# Patient Record
Sex: Male | Born: 1990 | Race: Black or African American | Hispanic: No | Marital: Single | Smoking: Current every day smoker
Health system: Southern US, Community
[De-identification: ages and names within clinical notes are randomized; demographics above are authoritative.]

## PROBLEM LIST (undated history)

## (undated) DIAGNOSIS — F17200 Nicotine dependence, unspecified, uncomplicated: Secondary | ICD-10-CM

---

## 2017-12-21 ENCOUNTER — Inpatient Hospital Stay (HOSPITAL_COMMUNITY): Payer: No Typology Code available for payment source

## 2017-12-21 ENCOUNTER — Encounter (HOSPITAL_COMMUNITY): Payer: Self-pay | Admitting: Radiology

## 2017-12-21 ENCOUNTER — Emergency Department (HOSPITAL_COMMUNITY): Payer: No Typology Code available for payment source

## 2017-12-21 ENCOUNTER — Inpatient Hospital Stay (HOSPITAL_COMMUNITY)
Admission: EM | Admit: 2017-12-21 | Discharge: 2017-12-25 | DRG: 958 | Disposition: A | Discharge status: Home / Self Care | Payer: No Typology Code available for payment source | Attending: General Surgery | Admitting: General Surgery

## 2017-12-21 ENCOUNTER — Other Ambulatory Visit: Payer: Self-pay

## 2017-12-21 DIAGNOSIS — S32462A Displaced associated transverse-posterior fracture of left acetabulum, initial encounter for closed fracture: Secondary | ICD-10-CM | POA: Diagnosis present

## 2017-12-21 DIAGNOSIS — S73005A Unspecified dislocation of left hip, initial encounter: Secondary | ICD-10-CM | POA: Diagnosis present

## 2017-12-21 DIAGNOSIS — S2241XA Multiple fractures of ribs, right side, initial encounter for closed fracture: Secondary | ICD-10-CM | POA: Diagnosis present

## 2017-12-21 DIAGNOSIS — S270XXA Traumatic pneumothorax, initial encounter: Secondary | ICD-10-CM | POA: Diagnosis present

## 2017-12-21 DIAGNOSIS — Y92410 Unspecified street and highway as the place of occurrence of the external cause: Secondary | ICD-10-CM

## 2017-12-21 DIAGNOSIS — F17210 Nicotine dependence, cigarettes, uncomplicated: Secondary | ICD-10-CM | POA: Diagnosis present

## 2017-12-21 DIAGNOSIS — Z419 Encounter for procedure for purposes other than remedying health state, unspecified: Secondary | ICD-10-CM

## 2017-12-21 DIAGNOSIS — M25552 Pain in left hip: Secondary | ICD-10-CM | POA: Diagnosis present

## 2017-12-21 DIAGNOSIS — J939 Pneumothorax, unspecified: Secondary | ICD-10-CM

## 2017-12-21 DIAGNOSIS — T148XXA Other injury of unspecified body region, initial encounter: Secondary | ICD-10-CM

## 2017-12-21 DIAGNOSIS — S32422A Displaced fracture of posterior wall of left acetabulum, initial encounter for closed fracture: Secondary | ICD-10-CM | POA: Diagnosis present

## 2017-12-21 DIAGNOSIS — S2249XA Multiple fractures of ribs, unspecified side, initial encounter for closed fracture: Secondary | ICD-10-CM | POA: Diagnosis present

## 2017-12-21 DIAGNOSIS — S32049A Unspecified fracture of fourth lumbar vertebra, initial encounter for closed fracture: Secondary | ICD-10-CM | POA: Diagnosis present

## 2017-12-21 DIAGNOSIS — S27321A Contusion of lung, unilateral, initial encounter: Secondary | ICD-10-CM | POA: Diagnosis present

## 2017-12-21 DIAGNOSIS — S32409A Unspecified fracture of unspecified acetabulum, initial encounter for closed fracture: Secondary | ICD-10-CM

## 2017-12-21 DIAGNOSIS — D62 Acute posthemorrhagic anemia: Secondary | ICD-10-CM | POA: Diagnosis not present

## 2017-12-21 DIAGNOSIS — T1490XA Injury, unspecified, initial encounter: Secondary | ICD-10-CM

## 2017-12-21 DIAGNOSIS — F17200 Nicotine dependence, unspecified, uncomplicated: Secondary | ICD-10-CM | POA: Diagnosis present

## 2017-12-21 HISTORY — DX: Nicotine dependence, unspecified, uncomplicated: F17.200

## 2017-12-21 LAB — I-STAT CHEM 8, ED
BUN: 8 mg/dL (ref 6–20)
CALCIUM ION: 1.09 mmol/L — AB (ref 1.15–1.40)
Chloride: 107 mmol/L (ref 98–111)
Creatinine, Ser: 1.1 mg/dL (ref 0.61–1.24)
Glucose, Bld: 103 mg/dL — ABNORMAL HIGH (ref 70–99)
HEMATOCRIT: 46 % (ref 39.0–52.0)
Hemoglobin: 15.6 g/dL (ref 13.0–17.0)
Potassium: 3.1 mmol/L — ABNORMAL LOW (ref 3.5–5.1)
SODIUM: 142 mmol/L (ref 135–145)
TCO2: 21 mmol/L — AB (ref 22–32)

## 2017-12-21 LAB — PROTIME-INR
INR: 1.19
Prothrombin Time: 15 seconds (ref 11.4–15.2)

## 2017-12-21 LAB — COMPREHENSIVE METABOLIC PANEL
ALBUMIN: 3.9 g/dL (ref 3.5–5.0)
ALT: 56 U/L — ABNORMAL HIGH (ref 0–44)
ANION GAP: 10 (ref 5–15)
AST: 128 U/L — ABNORMAL HIGH (ref 15–41)
Alkaline Phosphatase: 60 U/L (ref 38–126)
BUN: 9 mg/dL (ref 6–20)
CHLORIDE: 109 mmol/L (ref 98–111)
CO2: 23 mmol/L (ref 22–32)
Calcium: 8.8 mg/dL — ABNORMAL LOW (ref 8.9–10.3)
Creatinine, Ser: 1.05 mg/dL (ref 0.61–1.24)
GFR calc Af Amer: >60 mL/min (ref >60)
GFR calc non Af Amer: >60 mL/min (ref >60)
GLUCOSE: 102 mg/dL — AB (ref 70–99)
POTASSIUM: 3.4 mmol/L — AB (ref 3.5–5.1)
SODIUM: 142 mmol/L (ref 135–145)
Total Bilirubin: 0.7 mg/dL (ref 0.3–1.2)
Total Protein: 6.7 g/dL (ref 6.5–8.1)

## 2017-12-21 LAB — CBC
HEMATOCRIT: 45.2 % (ref 39.0–52.0)
HEMOGLOBIN: 14.9 g/dL (ref 13.0–17.0)
MCH: 32.3 pg (ref 26.0–34.0)
MCHC: 33 g/dL (ref 30.0–36.0)
MCV: 97.8 fL (ref 78.0–100.0)
Platelets: 270 10*3/uL (ref 150–400)
RBC: 4.62 MIL/uL (ref 4.22–5.81)
RDW: 13.1 % (ref 11.5–15.5)
WBC: 23.6 10*3/uL — ABNORMAL HIGH (ref 4.0–10.5)

## 2017-12-21 LAB — ETHANOL: Alcohol, Ethyl (B): 67 mg/dL — ABNORMAL HIGH (ref <10)

## 2017-12-21 LAB — CDS SEROLOGY

## 2017-12-21 LAB — I-STAT CG4 LACTIC ACID, ED: Lactic Acid, Venous: 2.75 mmol/L (ref 0.5–1.9)

## 2017-12-21 LAB — LACTIC ACID, PLASMA: Lactic Acid, Venous: 1.2 mmol/L (ref 0.5–1.9)

## 2017-12-21 LAB — SAMPLE TO BLOOD BANK

## 2017-12-21 MED ORDER — DOCUSATE SODIUM 100 MG PO CAPS
100.0000 mg | ORAL_CAPSULE | Freq: Two times a day (BID) | ORAL | Status: DC
  Administered 2017-12-21 – 2017-12-25 (×6): 100 mg via ORAL
  Filled 2017-12-21 (×6): qty 1

## 2017-12-21 MED ORDER — HYDROMORPHONE HCL 1 MG/ML IJ SOLN
1.0000 mg | Freq: Once | INTRAMUSCULAR | Status: AC
  Administered 2017-12-21: 1 mg via INTRAVENOUS

## 2017-12-21 MED ORDER — ONDANSETRON 4 MG PO TBDP: 4.0000 mg | ORAL_TABLET | Freq: Four times a day (QID) | ORAL | Status: DC | PRN

## 2017-12-21 MED ORDER — OXYCODONE HCL 5 MG PO TABS
10.0000 mg | ORAL_TABLET | ORAL | Status: DC | PRN
  Administered 2017-12-21 – 2017-12-25 (×14): 10 mg via ORAL
  Filled 2017-12-21 (×14): qty 2

## 2017-12-21 MED ORDER — MUPIROCIN 2 % EX OINT
1.0000 "application " | TOPICAL_OINTMENT | Freq: Two times a day (BID) | CUTANEOUS | Status: AC
  Administered 2017-12-22 – 2017-12-23 (×2): 1 via NASAL
  Filled 2017-12-21 (×2): qty 22

## 2017-12-21 MED ORDER — OXYCODONE HCL 5 MG PO TABS: 5.0000 mg | ORAL_TABLET | ORAL | Status: DC | PRN

## 2017-12-21 MED ORDER — PROPOFOL 10 MG/ML IV BOLUS: 0.5000 mg/kg | Freq: Once | INTRAVENOUS | Status: DC

## 2017-12-21 MED ORDER — PROPOFOL 10 MG/ML IV BOLUS
INTRAVENOUS | Status: AC
  Filled 2017-12-21: qty 20

## 2017-12-21 MED ORDER — MORPHINE SULFATE (PF) 2 MG/ML IV SOLN
2.0000 mg | INTRAVENOUS | Status: DC | PRN
  Administered 2017-12-22 – 2017-12-24 (×5): 2 mg via INTRAVENOUS
  Filled 2017-12-21 (×5): qty 1

## 2017-12-21 MED ORDER — FENTANYL CITRATE (PF) 100 MCG/2ML IJ SOLN
100.0000 ug | Freq: Once | INTRAMUSCULAR | Status: AC
  Administered 2017-12-21: 100 ug via INTRAVENOUS

## 2017-12-21 MED ORDER — SODIUM CHLORIDE 0.9 % IV SOLN
INTRAVENOUS | Status: DC
  Administered 2017-12-21 – 2017-12-23 (×5): via INTRAVENOUS

## 2017-12-21 MED ORDER — METHOCARBAMOL 500 MG PO TABS
500.0000 mg | ORAL_TABLET | Freq: Three times a day (TID) | ORAL | Status: DC | PRN
  Administered 2017-12-21 – 2017-12-25 (×6): 500 mg via ORAL
  Filled 2017-12-21 (×6): qty 1

## 2017-12-21 MED ORDER — FENTANYL CITRATE (PF) 100 MCG/2ML IJ SOLN
INTRAMUSCULAR | Status: AC
  Filled 2017-12-21: qty 2

## 2017-12-21 MED ORDER — POLYETHYLENE GLYCOL 3350 17 G PO PACK: 17.0000 g | PACK | Freq: Every day | ORAL | Status: DC | PRN

## 2017-12-21 MED ORDER — ONDANSETRON HCL 4 MG/2ML IJ SOLN: 4.0000 mg | Freq: Four times a day (QID) | INTRAMUSCULAR | Status: DC | PRN

## 2017-12-21 MED ORDER — HYDROMORPHONE HCL 1 MG/ML IJ SOLN
INTRAMUSCULAR | Status: AC
  Filled 2017-12-21: qty 1

## 2017-12-21 MED ORDER — ENOXAPARIN SODIUM 40 MG/0.4ML ~~LOC~~ SOLN
40.0000 mg | SUBCUTANEOUS | Status: DC
  Administered 2017-12-22: 40 mg via SUBCUTANEOUS
  Filled 2017-12-21: qty 0.4

## 2017-12-21 MED ORDER — IOPAMIDOL (ISOVUE-300) INJECTION 61%
INTRAVENOUS | Status: AC
  Administered 2017-12-21: 100 mL
  Filled 2017-12-21: qty 100

## 2017-12-21 MED ORDER — ACETAMINOPHEN 325 MG PO TABS
650.0000 mg | ORAL_TABLET | ORAL | Status: DC | PRN
  Administered 2017-12-21 – 2017-12-23 (×4): 650 mg via ORAL
  Filled 2017-12-21 (×4): qty 2

## 2017-12-21 MED ORDER — ONDANSETRON HCL 4 MG/2ML IJ SOLN
4.0000 mg | Freq: Once | INTRAMUSCULAR | Status: AC
  Administered 2017-12-21: 4 mg via INTRAVENOUS

## 2017-12-21 MED ORDER — HYDRALAZINE HCL 20 MG/ML IJ SOLN: 10.0000 mg | INTRAMUSCULAR | Status: DC | PRN

## 2017-12-21 MED ORDER — ONDANSETRON HCL 4 MG/2ML IJ SOLN
INTRAMUSCULAR | Status: AC
  Filled 2017-12-21: qty 2

## 2017-12-21 MED ORDER — KCL IN DEXTROSE-NACL 20-5-0.45 MEQ/L-%-% IV SOLN: INTRAVENOUS | Status: DC

## 2017-12-21 MED ORDER — PROPOFOL 10 MG/ML IV BOLUS
INTRAVENOUS | Status: AC | PRN
  Administered 2017-12-21: 37.4 mg via INTRAVENOUS
  Administered 2017-12-21 (×3): 10 mg via INTRAVENOUS

## 2017-12-21 NOTE — ED Notes (Signed)
Dr.Zackowski made aware of pt Lactic Acid. ED-Lab.

## 2017-12-21 NOTE — ED Triage Notes (Signed)
See trauma notes
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
## 2017-12-21 NOTE — Progress Notes (Signed)
The patient was monitored for conscious sedation. The patient tolerated well. Will continue to monitor.

## 2017-12-21 NOTE — Progress Notes (Signed)
Orthopedic Tech Progress Note Patient Details:  Frederick Walker 07/07/1990 [id number]  Musculoskeletal Traction Type of Traction: Skeletal (Balanced Suspension) Traction Location: lle Traction Weight: 20 lbs   Post Interventions Patient Tolerated: Well Instructions Provided: Care of device Viewed order from doctor's order list  [name] 12/21/2017, 3:29 PM
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
## 2017-12-22 ENCOUNTER — Inpatient Hospital Stay (HOSPITAL_COMMUNITY): Payer: No Typology Code available for payment source

## 2017-12-22 ENCOUNTER — Encounter (HOSPITAL_COMMUNITY): Payer: Self-pay | Admitting: *Deleted

## 2017-12-22 DIAGNOSIS — S27321A Contusion of lung, unilateral, initial encounter: Secondary | ICD-10-CM

## 2017-12-22 DIAGNOSIS — S73005A Unspecified dislocation of left hip, initial encounter: Secondary | ICD-10-CM | POA: Diagnosis present

## 2017-12-22 DIAGNOSIS — S270XXA Traumatic pneumothorax, initial encounter: Secondary | ICD-10-CM

## 2017-12-22 DIAGNOSIS — S2249XA Multiple fractures of ribs, unspecified side, initial encounter for closed fracture: Secondary | ICD-10-CM | POA: Diagnosis present

## 2017-12-22 DIAGNOSIS — S32422A Displaced fracture of posterior wall of left acetabulum, initial encounter for closed fracture: Secondary | ICD-10-CM | POA: Diagnosis present

## 2017-12-22 LAB — SURGICAL PCR SCREEN
MRSA, PCR: NEGATIVE
STAPHYLOCOCCUS AUREUS: NEGATIVE

## 2017-12-22 LAB — URINALYSIS, ROUTINE W REFLEX MICROSCOPIC
Bacteria, UA: NONE SEEN
Bilirubin Urine: NEGATIVE
Glucose, UA: NEGATIVE mg/dL
Ketones, ur: NEGATIVE mg/dL
Leukocytes, UA: NEGATIVE
Nitrite: NEGATIVE
PROTEIN: NEGATIVE mg/dL
Specific Gravity, Urine: 1.025 (ref 1.005–1.030)
pH: 5 (ref 5.0–8.0)

## 2017-12-22 LAB — CBC
HCT: 37.2 % — ABNORMAL LOW (ref 39.0–52.0)
Hemoglobin: 12.5 g/dL — ABNORMAL LOW (ref 13.0–17.0)
MCH: 32.4 pg (ref 26.0–34.0)
MCHC: 33.6 g/dL (ref 30.0–36.0)
MCV: 96.4 fL (ref 78.0–100.0)
PLATELETS: 190 10*3/uL (ref 150–400)
RBC: 3.86 MIL/uL — ABNORMAL LOW (ref 4.22–5.81)
RDW: 13 % (ref 11.5–15.5)
WBC: 12.5 10*3/uL — AB (ref 4.0–10.5)

## 2017-12-22 LAB — BASIC METABOLIC PANEL
ANION GAP: 5 (ref 5–15)
BUN: 6 mg/dL (ref 6–20)
CO2: 27 mmol/L (ref 22–32)
CREATININE: 1.04 mg/dL (ref 0.61–1.24)
Calcium: 8.3 mg/dL — ABNORMAL LOW (ref 8.9–10.3)
Chloride: 105 mmol/L (ref 98–111)
GFR calc non Af Amer: >60 mL/min (ref >60)
Glucose, Bld: 115 mg/dL — ABNORMAL HIGH (ref 70–99)
Potassium: 3.7 mmol/L (ref 3.5–5.1)
SODIUM: 137 mmol/L (ref 135–145)

## 2017-12-22 LAB — HIV ANTIBODY (ROUTINE TESTING W REFLEX): HIV SCREEN 4TH GENERATION: NONREACTIVE

## 2017-12-22 MED ORDER — ENOXAPARIN SODIUM 40 MG/0.4ML ~~LOC~~ SOLN
40.0000 mg | SUBCUTANEOUS | Status: DC
  Administered 2017-12-24 – 2017-12-25 (×2): 40 mg via SUBCUTANEOUS
  Filled 2017-12-22 (×2): qty 0.4

## 2017-12-22 MED ORDER — POLYETHYLENE GLYCOL 3350 17 G PO PACK
17.0000 g | PACK | Freq: Every day | ORAL | Status: DC
  Administered 2017-12-22 – 2017-12-25 (×3): 17 g via ORAL
  Filled 2017-12-22 (×3): qty 1
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
## 2017-12-23 ENCOUNTER — Encounter (HOSPITAL_COMMUNITY): Admission: EM | Disposition: A | Discharge status: Home / Self Care | Payer: Self-pay | Source: Home / Self Care

## 2017-12-23 ENCOUNTER — Inpatient Hospital Stay (HOSPITAL_COMMUNITY): Payer: No Typology Code available for payment source | Admitting: Certified Registered"

## 2017-12-23 ENCOUNTER — Inpatient Hospital Stay (HOSPITAL_COMMUNITY): Payer: No Typology Code available for payment source

## 2017-12-23 ENCOUNTER — Encounter (HOSPITAL_COMMUNITY): Payer: Self-pay

## 2017-12-23 HISTORY — PX: ORIF ACETABULAR FRACTURE: SHX5029

## 2017-12-23 LAB — CBC
HCT: 37.5 % — ABNORMAL LOW (ref 39.0–52.0)
HEMOGLOBIN: 12.6 g/dL — AB (ref 13.0–17.0)
MCH: 32.5 pg (ref 26.0–34.0)
MCHC: 33.6 g/dL (ref 30.0–36.0)
MCV: 96.6 fL (ref 78.0–100.0)
Platelets: 168 10*3/uL (ref 150–400)
RBC: 3.88 MIL/uL — AB (ref 4.22–5.81)
RDW: 12.7 % (ref 11.5–15.5)
WBC: 9.2 10*3/uL (ref 4.0–10.5)

## 2017-12-23 LAB — BASIC METABOLIC PANEL
ANION GAP: 5 (ref 5–15)
BUN: <5 mg/dL — ABNORMAL LOW (ref 6–20)
CO2: 29 mmol/L (ref 22–32)
Calcium: 8.4 mg/dL — ABNORMAL LOW (ref 8.9–10.3)
Chloride: 104 mmol/L (ref 98–111)
Creatinine, Ser: 0.91 mg/dL (ref 0.61–1.24)
Glucose, Bld: 108 mg/dL — ABNORMAL HIGH (ref 70–99)
POTASSIUM: 3.8 mmol/L (ref 3.5–5.1)
SODIUM: 138 mmol/L (ref 135–145)

## 2017-12-23 SURGERY — OPEN REDUCTION INTERNAL FIXATION (ORIF) ACETABULAR FRACTURE
Anesthesia: General | Site: Hip | Laterality: Left

## 2017-12-23 MED ORDER — LACTATED RINGERS IV SOLN
INTRAVENOUS | Status: DC | PRN
  Administered 2017-12-23: 13:00:00 via INTRAVENOUS

## 2017-12-23 MED ORDER — PROPOFOL 10 MG/ML IV BOLUS
INTRAVENOUS | Status: AC
  Filled 2017-12-23: qty 20

## 2017-12-23 MED ORDER — DEXAMETHASONE SODIUM PHOSPHATE 10 MG/ML IJ SOLN
INTRAMUSCULAR | Status: AC
  Filled 2017-12-23: qty 1

## 2017-12-23 MED ORDER — CEFAZOLIN SODIUM-DEXTROSE 2-4 GM/100ML-% IV SOLN
2.0000 g | INTRAVENOUS | Status: AC
  Administered 2017-12-23: 2 g via INTRAVENOUS
  Filled 2017-12-23: qty 100

## 2017-12-23 MED ORDER — FENTANYL CITRATE (PF) 250 MCG/5ML IJ SOLN
INTRAMUSCULAR | Status: AC
  Filled 2017-12-23: qty 5

## 2017-12-23 MED ORDER — METHOCARBAMOL 750 MG PO TABS
750.0000 mg | ORAL_TABLET | Freq: Three times a day (TID) | ORAL | Status: DC
  Administered 2017-12-23 – 2017-12-25 (×4): 750 mg via ORAL
  Filled 2017-12-23 (×4): qty 1

## 2017-12-23 MED ORDER — DEXMEDETOMIDINE HCL IN NACL 200 MCG/50ML IV SOLN
INTRAVENOUS | Status: AC
  Filled 2017-12-23: qty 50

## 2017-12-23 MED ORDER — DEXAMETHASONE SODIUM PHOSPHATE 10 MG/ML IJ SOLN
INTRAMUSCULAR | Status: DC | PRN
  Administered 2017-12-23: 10 mg via INTRAVENOUS

## 2017-12-23 MED ORDER — GLYCOPYRROLATE PF 0.2 MG/ML IJ SOSY
PREFILLED_SYRINGE | INTRAMUSCULAR | Status: AC
  Filled 2017-12-23: qty 3

## 2017-12-23 MED ORDER — ACETAMINOPHEN 325 MG PO TABS
650.0000 mg | ORAL_TABLET | Freq: Four times a day (QID) | ORAL | Status: DC
  Administered 2017-12-23 – 2017-12-25 (×7): 650 mg via ORAL
  Filled 2017-12-23 (×8): qty 2

## 2017-12-23 MED ORDER — ROCURONIUM BROMIDE 50 MG/5ML IV SOSY
PREFILLED_SYRINGE | INTRAVENOUS | Status: AC
  Filled 2017-12-23: qty 5

## 2017-12-23 MED ORDER — MIDAZOLAM HCL 5 MG/5ML IJ SOLN
INTRAMUSCULAR | Status: DC | PRN
  Administered 2017-12-23: 2 mg via INTRAVENOUS

## 2017-12-23 MED ORDER — GLYCOPYRROLATE 0.2 MG/ML IJ SOLN
INTRAMUSCULAR | Status: DC | PRN
  Administered 2017-12-23: 0.1 mg via INTRAVENOUS

## 2017-12-23 MED ORDER — PHENYLEPHRINE 40 MCG/ML (10ML) SYRINGE FOR IV PUSH (FOR BLOOD PRESSURE SUPPORT)
PREFILLED_SYRINGE | INTRAVENOUS | Status: DC | PRN
  Administered 2017-12-23: 80 ug via INTRAVENOUS

## 2017-12-23 MED ORDER — ONDANSETRON HCL 4 MG/2ML IJ SOLN
INTRAMUSCULAR | Status: AC
  Filled 2017-12-23: qty 2

## 2017-12-23 MED ORDER — LACTATED RINGERS IV SOLN
INTRAVENOUS | Status: DC
  Administered 2017-12-23: 12:00:00 via INTRAVENOUS

## 2017-12-23 MED ORDER — LIDOCAINE 2% (20 MG/ML) 5 ML SYRINGE
INTRAMUSCULAR | Status: DC | PRN
  Administered 2017-12-23: 60 mg via INTRAVENOUS

## 2017-12-23 MED ORDER — PHENYLEPHRINE 40 MCG/ML (10ML) SYRINGE FOR IV PUSH (FOR BLOOD PRESSURE SUPPORT)
PREFILLED_SYRINGE | INTRAVENOUS | Status: AC
  Filled 2017-12-23: qty 20

## 2017-12-23 MED ORDER — ONDANSETRON HCL 4 MG/2ML IJ SOLN
INTRAMUSCULAR | Status: DC | PRN
  Administered 2017-12-23: 4 mg via INTRAVENOUS

## 2017-12-23 MED ORDER — PROPOFOL 10 MG/ML IV BOLUS
INTRAVENOUS | Status: DC | PRN
  Administered 2017-12-23: 150 mg via INTRAVENOUS

## 2017-12-23 MED ORDER — MIDAZOLAM HCL 2 MG/2ML IJ SOLN
INTRAMUSCULAR | Status: AC
  Filled 2017-12-23: qty 2

## 2017-12-23 MED ORDER — ROCURONIUM BROMIDE 100 MG/10ML IV SOLN
INTRAVENOUS | Status: DC | PRN
  Administered 2017-12-23 (×2): 10 mg via INTRAVENOUS
  Administered 2017-12-23: 30 mg via INTRAVENOUS
  Administered 2017-12-23: 10 mg via INTRAVENOUS
  Administered 2017-12-23: 5 mg via INTRAVENOUS
  Administered 2017-12-23: 50 mg via INTRAVENOUS
  Administered 2017-12-23: 5 mg via INTRAVENOUS
  Administered 2017-12-23: 20 mg via INTRAVENOUS

## 2017-12-23 MED ORDER — 0.9 % SODIUM CHLORIDE (POUR BTL) OPTIME
TOPICAL | Status: DC | PRN
  Administered 2017-12-23: 1000 mL

## 2017-12-23 MED ORDER — LIDOCAINE 2% (20 MG/ML) 5 ML SYRINGE
INTRAMUSCULAR | Status: AC
  Filled 2017-12-23: qty 5

## 2017-12-23 MED ORDER — DEXMEDETOMIDINE HCL 200 MCG/2ML IV SOLN
INTRAVENOUS | Status: DC | PRN
  Administered 2017-12-23 (×4): 4 ug via INTRAVENOUS

## 2017-12-23 MED ORDER — FENTANYL CITRATE (PF) 100 MCG/2ML IJ SOLN
INTRAMUSCULAR | Status: DC | PRN
  Administered 2017-12-23: 100 ug via INTRAVENOUS
  Administered 2017-12-23 (×3): 50 ug via INTRAVENOUS
  Administered 2017-12-23: 100 ug via INTRAVENOUS
  Administered 2017-12-23: 50 ug via INTRAVENOUS

## 2017-12-23 MED ORDER — KETOROLAC TROMETHAMINE 15 MG/ML IJ SOLN
15.0000 mg | Freq: Four times a day (QID) | INTRAMUSCULAR | Status: DC
  Administered 2017-12-23 – 2017-12-25 (×8): 15 mg via INTRAVENOUS
  Filled 2017-12-23 (×8): qty 1

## 2017-12-23 MED ORDER — ROCURONIUM BROMIDE 50 MG/5ML IV SOSY
PREFILLED_SYRINGE | INTRAVENOUS | Status: AC
  Filled 2017-12-23: qty 10

## 2017-12-23 MED ORDER — SODIUM CHLORIDE 0.9 % IV SOLN
INTRAVENOUS | Status: DC | PRN
  Administered 2017-12-23: 10 ug/min via INTRAVENOUS

## 2017-12-23 MED ORDER — SUGAMMADEX SODIUM 200 MG/2ML IV SOLN
INTRAVENOUS | Status: DC | PRN
  Administered 2017-12-23: 200 mg via INTRAVENOUS

## 2017-12-23 MED ORDER — LACTATED RINGERS IV SOLN
INTRAVENOUS | Status: DC | PRN
  Administered 2017-12-23 (×2): via INTRAVENOUS

## 2017-12-23 SURGICAL SUPPLY — 80 items
BIT DRILL 200X4XGRY 5XPIN (BIT) ×1 IMPLANT
BIT DRILL 3.5 LONG (BIT) ×2 IMPLANT
BIT DRILL 3.5MM LONG (BIT) ×1
BIT DRILL SCALD PELVS II 2.5MM (BIT) ×1 IMPLANT
BIT DRL 200X4XGRY 5XPIN (BIT) ×1
BLADE CLIPPER SURG (BLADE) IMPLANT
BRUSH SCRUB SURG 4.25 DISP (MISCELLANEOUS) ×6 IMPLANT
CLOSURE WOUND 1/2 X4 (GAUZE/BANDAGES/DRESSINGS) ×1
COVER SURGICAL LIGHT HANDLE (MISCELLANEOUS) ×3 IMPLANT
DRAPE C-ARM 42X72 X-RAY (DRAPES) ×3 IMPLANT
DRAPE C-ARMOR (DRAPES) ×3 IMPLANT
DRAPE INCISE IOBAN 66X45 STRL (DRAPES) ×3 IMPLANT
DRAPE INCISE IOBAN 85X60 (DRAPES) ×3 IMPLANT
DRAPE ORTHO SPLIT 77X108 STRL (DRAPES) ×4
DRAPE SURG ORHT 6 SPLT 77X108 (DRAPES) ×2 IMPLANT
DRAPE U-SHAPE 47X51 STRL (DRAPES) ×3 IMPLANT
DRILL BIT 4.0MM (BIT) ×2
DRILL SCALED PELVIS II 2.5MM (BIT) ×3
DRSG MEPILEX BORDER 4X12 (GAUZE/BANDAGES/DRESSINGS) ×3 IMPLANT
DRSG MEPILEX BORDER 4X4 (GAUZE/BANDAGES/DRESSINGS) ×6 IMPLANT
DRSG MEPILEX BORDER 4X8 (GAUZE/BANDAGES/DRESSINGS) IMPLANT
ELECT BLADE 4.0 EZ CLEAN MEGAD (MISCELLANEOUS) ×3
ELECT BLADE 6.5 EXT (BLADE) ×3 IMPLANT
ELECT REM PT RETURN 9FT ADLT (ELECTROSURGICAL) ×3
ELECTRODE BLDE 4.0 EZ CLN MEGD (MISCELLANEOUS) ×1 IMPLANT
ELECTRODE REM PT RTRN 9FT ADLT (ELECTROSURGICAL) ×1 IMPLANT
GLOVE BIO SURGEON STRL SZ7.5 (GLOVE) ×3 IMPLANT
GLOVE BIO SURGEON STRL SZ8 (GLOVE) ×3 IMPLANT
GLOVE BIOGEL PI IND STRL 7.5 (GLOVE) ×1 IMPLANT
GLOVE BIOGEL PI IND STRL 8 (GLOVE) ×1 IMPLANT
GLOVE BIOGEL PI INDICATOR 7.5 (GLOVE) ×2
GLOVE BIOGEL PI INDICATOR 8 (GLOVE) ×2
GOWN STRL REUS W/ TWL LRG LVL3 (GOWN DISPOSABLE) ×2 IMPLANT
GOWN STRL REUS W/ TWL XL LVL3 (GOWN DISPOSABLE) ×2 IMPLANT
GOWN STRL REUS W/TWL LRG LVL3 (GOWN DISPOSABLE) ×4
GOWN STRL REUS W/TWL XL LVL3 (GOWN DISPOSABLE) ×4
HANDPIECE INTERPULSE COAX TIP (DISPOSABLE) ×2
KIT BASIN OR (CUSTOM PROCEDURE TRAY) ×3 IMPLANT
KIT TURNOVER KIT B (KITS) ×3 IMPLANT
MANIFOLD NEPTUNE II (INSTRUMENTS) ×3 IMPLANT
NS IRRIG 1000ML POUR BTL (IV SOLUTION) ×3 IMPLANT
PACK TOTAL JOINT (CUSTOM PROCEDURE TRAY) ×3 IMPLANT
PAD ARMBOARD 7.5X6 YLW CONV (MISCELLANEOUS) ×6 IMPLANT
PIN APEX 6.0X250MM EXFIX (EXFIX) ×3 IMPLANT
PLATE ACET STRT 70.5M 6H (Plate) ×3 IMPLANT
PLATE ACET STRT 94.5M 8H (Plate) ×3 IMPLANT
RETRIEVER SUT HEWSON (MISCELLANEOUS) ×3 IMPLANT
SCREW 3.5X46MM (Screw) ×3 IMPLANT
SCREW CORT 2.5XFT 44X3.5XST (Screw) ×1 IMPLANT
SCREW CORTEX ST MATTA 3.5X24 (Screw) ×3 IMPLANT
SCREW CORTEX ST MATTA 3.5X28MM (Screw) ×3 IMPLANT
SCREW CORTEX ST MATTA 3.5X30MM (Screw) ×6 IMPLANT
SCREW CORTEX ST MATTA 3.5X32MM (Screw) ×6 IMPLANT
SCREW CORTEX ST MATTA 3.5X34MM (Screw) ×6 IMPLANT
SCREW CORTEX ST MATTA 3.5X36MM (Screw) ×3 IMPLANT
SCREW CORTEX ST MATTA 3.5X38M (Screw) ×3 IMPLANT
SCREW CORTEX ST MATTA 3.5X40MM (Screw) ×6 IMPLANT
SCREW CORTEX ST MATTA 3.5X55MM (Screw) ×3 IMPLANT
SCREW CORTICAL 3.5X44MM (Screw) ×2 IMPLANT
SET HNDPC FAN SPRY TIP SCT (DISPOSABLE) ×1 IMPLANT
SPONGE LAP 18X18 X RAY DECT (DISPOSABLE) IMPLANT
STAPLER VISISTAT 35W (STAPLE) ×3 IMPLANT
STRIP CLOSURE SKIN 1/2X4 (GAUZE/BANDAGES/DRESSINGS) ×2 IMPLANT
SUCTION FRAZIER HANDLE 10FR (MISCELLANEOUS) ×2
SUCTION TUBE FRAZIER 10FR DISP (MISCELLANEOUS) ×1 IMPLANT
SUT ETHILON 2 0 PSLX (SUTURE) ×6 IMPLANT
SUT FIBERWIRE #2 38 T-5 BLUE (SUTURE) ×6
SUT VIC AB 0 CT1 27 (SUTURE) ×2
SUT VIC AB 0 CT1 27XBRD ANBCTR (SUTURE) ×1 IMPLANT
SUT VIC AB 1 CT1 18XCR BRD 8 (SUTURE) ×1 IMPLANT
SUT VIC AB 1 CT1 27 (SUTURE) ×2
SUT VIC AB 1 CT1 27XBRD ANBCTR (SUTURE) ×1 IMPLANT
SUT VIC AB 1 CT1 8-18 (SUTURE) ×2
SUT VIC AB 2-0 CT1 27 (SUTURE) ×6
SUT VIC AB 2-0 CT1 TAPERPNT 27 (SUTURE) ×3 IMPLANT
SUTURE FIBERWR #2 38 T-5 BLUE (SUTURE) ×2 IMPLANT
TOWEL OR 17X24 6PK STRL BLUE (TOWEL DISPOSABLE) ×6 IMPLANT
TOWEL OR 17X26 10 PK STRL BLUE (TOWEL DISPOSABLE) ×6 IMPLANT
TRAY FOLEY MTR SLVR 16FR STAT (SET/KITS/TRAYS/PACK) IMPLANT
WATER STERILE IRR 1000ML POUR (IV SOLUTION) IMPLANT
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
## 2017-12-23 NOTE — Transfer of Care (Signed)
Immediate Anesthesia Transfer of Care Note  Patient: Frederick Walker  Procedure(s) Performed: OPEN REDUCTION INTERNAL FIXATION LEFT ACETABULAR FRACTURE (Left Hip)  Patient Location: PACU  Anesthesia Type:General  Level of Consciousness: sedated  Airway & Oxygen Therapy: Patient Spontanous Breathing and Patient connected to nasal cannula oxygen  Post-op Assessment: Report given to RN and Post -op Vital signs reviewed and stable  Post vital signs: Reviewed and stable  Last Vitals:  Vitals Value Taken Time  BP 146/84 12/23/2017  5:43 PM  Temp    Pulse 70 12/23/2017  5:43 PM  Resp 22 12/23/2017  5:43 PM  SpO2 96 % 12/23/2017  5:43 PM  Vitals shown include unvalidated device data.  Last Pain:  Vitals:   12/23/17 1743  TempSrc:   PainSc: (P) Asleep      Patients Stated Pain Goal: 0 (12/23/17 0800)  Complications: No apparent anesthesia complications

## 2017-12-23 NOTE — Anesthesia Preprocedure Evaluation (Addendum)
Anesthesia Evaluation  Patient identified by MRN, date of birth, ID band Patient awake    Reviewed: Allergy & Precautions, NPO status , Patient's Chart, lab work & pertinent test results  Airway Mallampati: II       Dental   Pulmonary Current Smoker,    breath sounds clear to auscultation       Cardiovascular negative cardio ROS   Rhythm:regular Rate:Normal     Neuro/Psych    GI/Hepatic negative GI ROS, Neg liver ROS,   Endo/Other  negative endocrine ROS  Renal/GU negative Renal ROS     Musculoskeletal   Abdominal   Peds  Hematology   Anesthesia Other Findings   Reproductive/Obstetrics                            Anesthesia Physical Anesthesia Plan  ASA: III  Anesthesia Plan: General   Post-op Pain Management:    Induction: Intravenous  PONV Risk Score and Plan: Treatment may vary due to age or medical condition, Ondansetron, Dexamethasone and Midazolam  Airway Management Planned:   Additional Equipment:   Intra-op Plan:   Post-operative Plan: Extubation in OR  Informed Consent: I have reviewed the patients History and Physical, chart, labs and discussed the procedure including the risks, benefits and alternatives for the proposed anesthesia with the patient or authorized representative who has indicated his/her understanding and acceptance.   Dental advisory given  Plan Discussed with: CRNA and Anesthesiologist  Anesthesia Plan Comments:         Anesthesia Quick Evaluation

## 2017-12-23 NOTE — Anesthesia Procedure Notes (Addendum)
Procedure Name: Intubation Date/Time: 12/23/2017 1:33 AM Performed by: Gwyndolyn Saxon, CRNA Pre-anesthesia Checklist: Patient identified, Emergency Drugs available, Suction available, Patient being monitored and Timeout performed Patient Re-evaluated:Patient Re-evaluated prior to induction Oxygen Delivery Method: Circle system utilized Preoxygenation: Pre-oxygenation with 100% oxygen Induction Type: IV induction Ventilation: Mask ventilation without difficulty Laryngoscope Size: Mac and 4 Grade View: Grade II Tube type: Oral Tube size: 7.5 mm Number of attempts: 2 Airway Equipment and Method: Patient positioned with wedge pillow and Stylet Placement Confirmation: ETT inserted through vocal cords under direct vision,  positive ETCO2 and breath sounds checked- equal and bilateral Secured at: 24 cm Tube secured with: Tape Dental Injury: Teeth and Oropharynx as per pre-operative assessment  Comments: perfromed by [place], SRNA

## 2017-12-24 ENCOUNTER — Ambulatory Visit: Payer: No Typology Code available for payment source

## 2017-12-24 ENCOUNTER — Encounter: Payer: Self-pay | Admitting: Radiation Oncology

## 2017-12-24 ENCOUNTER — Ambulatory Visit
Admit: 2017-12-24 | Discharge: 2017-12-24 | Disposition: A | Discharge status: Home / Self Care | Payer: No Typology Code available for payment source | Attending: Radiation Oncology | Admitting: Radiation Oncology

## 2017-12-24 DIAGNOSIS — S32422A Displaced fracture of posterior wall of left acetabulum, initial encounter for closed fracture: Secondary | ICD-10-CM

## 2017-12-24 DIAGNOSIS — M898X9 Other specified disorders of bone, unspecified site: Secondary | ICD-10-CM

## 2017-12-24 LAB — CBC
HCT: 31.1 % — ABNORMAL LOW (ref 39.0–52.0)
HEMOGLOBIN: 10.4 g/dL — AB (ref 13.0–17.0)
MCH: 31.9 pg (ref 26.0–34.0)
MCHC: 33.4 g/dL (ref 30.0–36.0)
MCV: 95.4 fL (ref 78.0–100.0)
PLATELETS: 181 10*3/uL (ref 150–400)
RBC: 3.26 MIL/uL — ABNORMAL LOW (ref 4.22–5.81)
RDW: 12.1 % (ref 11.5–15.5)
WBC: 13.2 10*3/uL — ABNORMAL HIGH (ref 4.0–10.5)
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
## 2017-12-24 NOTE — Progress Notes (Signed)
Spoke with Nash Mantis at [facility] regarding the patients radiation treatment today.  I informed her to set up carelink transportation to have the patient here at 4:20 pm for a 4:50 pm treatment.  Will continue to follow as necessary.  Coralyn Helling. Vickii Chafe, BSN

## 2017-12-24 NOTE — Plan of Care (Signed)
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
## 2017-12-24 NOTE — Progress Notes (Signed)
[facility] contacted and transportation to [facility] up for [employer]

## 2017-12-24 NOTE — Progress Notes (Signed)
Orthopaedic Trauma Service (OTS)  1 Day Post-Op Procedure(s) (LRB): OPEN REDUCTION INTERNAL FIXATION LEFT ACETABULAR FRACTURE (Left)  Subjective: Patient reports pain as and he is eager to move.    Objective: Current Vitals Blood pressure 138/88, pulse 68, temperature 98.6 F (37 C), temperature source Oral, resp. rate 16, height 5\' 10"  (1.778 m), weight 74.8 kg, SpO2 99 %. Vital signs in last 24 hours: Temp:  [97.9 F (36.6 C)-98.6 F (37 C)] 98.6 F (37 C) (09/04 0500) Pulse Rate:  [68-77] 68 (09/04 0500) Resp:  [15-22] 16 (09/04 0500) BP: (119-155)/(66-96) 138/88 (09/04 0500) SpO2:  [93 %-99 %] 99 % (09/04 0500)  Intake/Output from previous day: 09/03 0701 - 09/04 0700 In: 2580 [P.O.:580; I.V.:2000] Out: 580 [Urine:330; Blood:250]  LABS Recent Labs    12/21/17 1122 12/21/17 1138 12/22/17 0424 12/23/17 0349  HGB 14.9 15.6 12.5* 12.6*   Recent Labs    12/22/17 0424 12/23/17 0349  WBC 12.5* 9.2  RBC 3.86* 3.88*  HCT 37.2* 37.5*  PLT 190 168   Recent Labs    12/22/17 0424 12/23/17 0349  NA 137 138  K 3.7 3.8  CL 105 104  CO2 27 29  BUN 6 <5*  CREATININE 1.04 0.91  GLUCOSE 115* 108*  CALCIUM 8.3* 8.4*   Recent Labs    12/21/17 1122  INR 1.19     Physical Exam LLE  Dressing intact, clean, dry  Edema/ swelling controlled  Sens: DPN, SPN, TN intact  Motor: EHL, FHL, and lessor toe ext and flex all intact with good strength  Brisk cap refill, warm to touch  Assessment/Plan: 1 Day Post-Op Procedure(s) (LRB): OPEN REDUCTION INTERNAL FIXATION LEFT ACETABULAR FRACTURE (Left) 1. PT/OT TDWB 2. DVT proph Lovenox 3. D/c tomorrow 4. Labs just drawn this am  Myrene Galas, MD [facility] [phone] (p)

## 2017-12-25 ENCOUNTER — Encounter (HOSPITAL_COMMUNITY): Payer: Self-pay | Admitting: Orthopedic Surgery

## 2017-12-25 DIAGNOSIS — F17200 Nicotine dependence, unspecified, uncomplicated: Secondary | ICD-10-CM | POA: Diagnosis present

## 2017-12-25 HISTORY — DX: Nicotine dependence, unspecified, uncomplicated: F17.200

## 2017-12-25 LAB — CBC
HCT: 30.3 % — ABNORMAL LOW (ref 39.0–52.0)
Hemoglobin: 10.1 g/dL — ABNORMAL LOW (ref 13.0–17.0)
MCH: 32.5 pg (ref 26.0–34.0)
MCHC: 33.3 g/dL (ref 30.0–36.0)
MCV: 97.4 fL (ref 78.0–100.0)
PLATELETS: 215 10*3/uL (ref 150–400)
RBC: 3.11 MIL/uL — ABNORMAL LOW (ref 4.22–5.81)
RDW: 12.5 % (ref 11.5–15.5)
WBC: 7.9 10*3/uL (ref 4.0–10.5)

## 2017-12-25 MED ORDER — ENOXAPARIN (LOVENOX) PATIENT EDUCATION KIT
PACK | Freq: Once | Status: DC
  Filled 2017-12-25: qty 1

## 2017-12-25 MED ORDER — METHOCARBAMOL 750 MG PO TABS: 750.0000 mg | ORAL_TABLET | Freq: Three times a day (TID) | ORAL | 0 refills | Status: AC | PRN

## 2017-12-25 MED ORDER — ENOXAPARIN SODIUM 40 MG/0.4ML ~~LOC~~ SOLN: 40.0000 mg | SUBCUTANEOUS | 0 refills | Status: AC

## 2017-12-25 MED ORDER — DOCUSATE SODIUM 100 MG PO CAPS: 100.0000 mg | ORAL_CAPSULE | Freq: Two times a day (BID) | ORAL | 0 refills | Status: AC | PRN

## 2017-12-25 MED ORDER — OXYCODONE HCL 10 MG PO TABS: 5.0000 mg | ORAL_TABLET | Freq: Four times a day (QID) | ORAL | 0 refills | Status: AC | PRN

## 2017-12-25 MED ORDER — ACETAMINOPHEN 325 MG PO TABS: 1000.0000 mg | ORAL_TABLET | Freq: Four times a day (QID) | ORAL | Status: AC

## 2017-12-25 MED FILL — METHOCARBAMOL 750 MG TABS: 750 | 10 days supply | Qty: 30 | Fill #0

## 2017-12-25 MED FILL — ENOXAPARIN 40 MG/0.4 ML SYR: 40 | 30 days supply | Qty: 12 | Fill #0

## 2017-12-25 MED FILL — oxyCODONE HCL 10 MG TABS: 10 | 8 days supply | Qty: 30 | Fill #0
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
## 2017-12-25 NOTE — Plan of Care (Signed)
  Problem: Activity: Goal: Risk for activity intolerance will decrease Outcome: Adequate for Discharge   Problem: Pain Managment: Goal: General experience of comfort will improve Outcome: Adequate for Discharge   Problem: Safety: Goal: Ability to remain free from injury will improve Outcome: Adequate for Discharge   Problem: Skin Integrity: Goal: Risk for impaired skin integrity will decrease Outcome: Adequate for Discharge
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
## 2017-12-25 NOTE — Discharge Instructions (Signed)
Orthopaedic Trauma Service Discharge Instructions   General Discharge Instructions  WEIGHT BEARING STATUS: Touchdown weightbearing left leg  RANGE OF MOTION/ACTIVITY: Posterior hip precautions left hip.  Activity as tolerated while maintaining weightbearing restrictions.  Use crutches or walker for mobilizing  Continue to do home exercises that were taught to you by therapy while you are in the hospital  Wound Care: Daily wound care starting on 12/27/2017.  Remove current dressing.  If wound is dry may leave open to the air.  If there is still some drainage use 4 x 4 gauze and tape to cover.  See instructions below.  Okay to shower and clean wound with soap and water.  Do not apply any ointments or lotions such as Betadine, hydrogen peroxide or other body lotions to wound at this time.  Discharge Wound Care Instructions  Do NOT apply any ointments, solutions or lotions to pin sites or surgical wounds.  These prevent needed drainage and even though solutions like hydrogen peroxide kill bacteria, they also damage cells lining the pin sites that help fight infection.  Applying lotions or ointments can keep the wounds moist and can cause them to breakdown and open up as well. This can increase the risk for infection. When in doubt call the office.  Surgical incisions should be dressed daily.  If any drainage is noted, use one layer of adaptic, then gauze, Kerlix, and an ace wrap.  Once the incision is completely dry and without drainage, it may be left open to air out.  Showering may begin 36-48 hours later.  Cleaning gently with soap and water.  Traumatic wounds should be dressed daily as well.    One layer of adaptic, gauze, Kerlix, then ace wrap.  The adaptic can be discontinued once the draining has ceased    If you have a wet to dry dressing: wet the gauze with saline the squeeze as much saline out so the gauze is moist (not soaking wet), place moistened gauze over wound, then place a dry  gauze over the moist one, followed by Kerlix wrap, then ace wrap.   DVT/PE prophylaxis: Lovenox 40 mg 1 subcutaneous injection daily x 30 days  Diet: as you were eating previously.  Can use over the counter stool softeners and bowel preparations, such as Miralax, to help with bowel movements.  Narcotics can be constipating.  Be sure to drink plenty of fluids  PAIN MEDICATION USE AND EXPECTATIONS  You have likely been given narcotic medications to help control your pain.  After a traumatic event that results in an fracture (broken bone) with or without surgery, it is ok to use narcotic pain medications to help control one's pain.  We understand that everyone responds to pain differently and each individual patient will be evaluated on a regular basis for the continued need for narcotic medications. Ideally, narcotic medication use should last no more than 6-8 weeks (coinciding with fracture healing).   As a patient it is your responsibility as well to monitor narcotic medication use and report the amount and frequency you use these medications when you come to your office visit.   We would also advise that if you are using narcotic medications, you should take a dose prior to therapy to maximize you participation.  IF YOU ARE ON NARCOTIC MEDICATIONS IT IS NOT PERMISSIBLE TO OPERATE A MOTOR VEHICLE (MOTORCYCLE/CAR/TRUCK/MOPED) OR HEAVY MACHINERY DO NOT MIX NARCOTICS WITH OTHER CNS (CENTRAL NERVOUS SYSTEM) DEPRESSANTS SUCH AS ALCOHOL   STOP SMOKING OR USING  NICOTINE PRODUCTS!!!!  As discussed nicotine severely impairs your body's ability to heal surgical and traumatic wounds but also impairs bone healing.  Wounds and bone heal by forming microscopic blood vessels (angiogenesis) and nicotine is a vasoconstrictor (essentially, shrinks blood vessels).  Therefore, if vasoconstriction occurs to these microscopic blood vessels they essentially disappear and are unable to deliver necessary nutrients to the  healing tissue.  This is one modifiable factor that you can do to dramatically increase your chances of healing your injury.    (This means no smoking, no nicotine gum, patches, etc)  DO NOT USE NONSTEROIDAL ANTI-INFLAMMATORY DRUGS (NSAID'S)  Using products such as Advil (ibuprofen), Aleve (naproxen), Motrin (ibuprofen) for additional pain control during fracture healing can delay and/or prevent the healing response.  If you would like to take over the counter (OTC) medication, Tylenol (acetaminophen) is ok.  However, some narcotic medications that are given for pain control contain acetaminophen as well. Therefore, you should not exceed more than 4000 mg of tylenol in a day if you do not have liver disease.  Also note that there are may OTC medicines, such as cold medicines and allergy medicines that my contain tylenol as well.  If you have any questions about medications and/or interactions please ask your doctor/PA or your pharmacist.      ICE AND ELEVATE INJURED/OPERATIVE EXTREMITY  Using ice and elevating the injured extremity above your heart can help with swelling and pain control.  Icing in a pulsatile fashion, such as 20 minutes on and 20 minutes off, can be followed.    Do not place ice directly on skin. Make sure there is a barrier between to skin and the ice pack.    Using frozen items such as frozen peas works well as the conform nicely to the are that needs to be iced.  USE AN ACE WRAP OR TED HOSE FOR SWELLING CONTROL  In addition to icing and elevation, Ace wraps or TED hose are used to help limit and resolve swelling.  It is recommended to use Ace wraps or TED hose until you are informed to stop.    When using Ace Wraps start the wrapping distally (farthest away from the body) and wrap proximally (closer to the body)   Example: If you had surgery on your leg or thing and you do not have a splint on, start the ace wrap at the toes and work your way up to the thigh        If you had  surgery on your upper extremity and do not have a splint on, start the ace wrap at your fingers and work your way up to the upper arm  IF YOU ARE IN A SPLINT OR CAST DO NOT REMOVE IT FOR ANY REASON   If your splint gets wet for any reason please contact the office immediately. You may shower in your splint or cast as long as you keep it dry.  This can be done by wrapping in a cast cover or garbage back (or similar)  Do Not stick any thing down your splint or cast such as pencils, money, or hangers to try and scratch yourself with.  If you feel itchy take benadryl as prescribed on the bottle for itching  IF YOU ARE IN A CAM BOOT (BLACK BOOT)  You may remove boot periodically. Perform daily dressing changes as noted below.  Wash the liner of the boot regularly and wear a sock when wearing the boot. It is  recommended that you sleep in the boot until told otherwise  CALL THE OFFICE WITH ANY QUESTIONS OR CONCERNS: [phone]      Rib Fracture A rib fracture is a break or crack in one of the bones of the ribs. The ribs are like a cage that goes around your upper chest. A broken or cracked rib is often painful, but most do not cause other problems. Most rib fractures heal on their own in 1-3 months. Follow these instructions at home:  Avoid activities that cause pain to the injured area. Protect your injured area.  Slowly increase activity as told by your doctor.  Take medicine as told by your doctor.  Put ice on the injured area for the first 1-2 days after you have been treated or as told by your doctor. ? Put ice in a plastic bag. ? Place a towel between your skin and the bag. ? Leave the ice on for 15-20 minutes at a time, every 2 hours while you are awake.  Do deep breathing as told by your doctor. You may be told to: ? Take deep breaths many times a day. ? Cough many times a day while hugging a pillow. ? Use a device (incentive spirometer) to perform deep breathing many times a  day.  Drink enough fluids to keep your pee (urine) clear or pale yellow.  Do not wear a rib belt or binder. These do not allow you to breathe deeply. Get help right away if:  You have a fever.  You have trouble breathing.  You cannot stop coughing.  You cough up thick or bloody spit (mucus).  You feel sick to your stomach (nauseous), throw up (vomit), or have belly (abdominal) pain.  Your pain gets worse and medicine does not help. This information is not intended to replace advice given to you by your health care provider. Make sure you discuss any questions you have with your health care provider. Document Released: 01/16/2008 Document Revised: 09/14/2015 Document Reviewed: 06/10/2012 Elsevier Interactive Patient Education  [employer].
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
## 2017-12-25 NOTE — Progress Notes (Signed)
CSW completed SBIRT with patient. Patient denies and problems with substance use. Patient states he drinks occasionally and could stop drking at any time. Patent states that he had quit before when his daughter was born and could stop again. Patient denied needing any resources.   Antony Blackbird, [facility] Clinical Social Worker [phone]
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
## 2017-12-25 NOTE — Anesthesia Postprocedure Evaluation (Addendum)
Anesthesia Post Note  Patient: Frederick Walker  Procedure(s) Performed: OPEN REDUCTION INTERNAL FIXATION LEFT ACETABULAR FRACTURE (Left Hip)     Patient location during evaluation: PACU Anesthesia Type: General Level of consciousness: awake and alert Pain management: pain level controlled Vital Signs Assessment: post-procedure vital signs reviewed and stable Respiratory status: spontaneous breathing, nonlabored ventilation, respiratory function stable and patient connected to nasal cannula oxygen Cardiovascular status: blood pressure returned to baseline and stable Postop Assessment: no apparent nausea or vomiting Anesthetic complications: no    Last Vitals:  Vitals:   12/24/17 1740 12/24/17 2042  BP: (!) 132/93 (!) 146/79  Pulse: 63 98  Resp: 16 19  Temp: 37 C 37.1 C  SpO2: 100% 99%    Last Pain:  Vitals:   12/25/17 0628  TempSrc:   PainSc: 5                  Assia Meanor S

## 2018-01-06 NOTE — Progress Notes (Signed)
  Radiation Oncology         [phone] ________________________________  Name: Vernie Shanksevin Senk MRN: [id number]  Date: 12/24/2017  DOB: 1990-08-19  End of Treatment Note  Diagnosis:   Left acetabular fracture with risk of postoperative heterotopic ossification     Indication for treatment:  curative       Radiation treatment dates:   12/24/2017  Site/dose:   The patient was treated to a dose of 7 Gy in 1 fraction to the left hip. This was accomplished with AP and PA fields.  Narrative: The patient tolerated radiation treatment relatively well.   No difficulties.  Plan: The patient has completed radiation treatment. The patient will return to radiation oncology clinic on an as needed basis. ________________________________  Radene GunningJohn S. Yesennia Hirota, MD, PhD  This document serves as a record of services personally performed by Dorothy PufferJohn Lawerence Dery, MD. It was created on his behalf by Ivar BuryHaley Woodruff, a trained medical scribe. The creation of this record is based on the scribe's personal observations and the provider's statements to them. This document has been checked and approved by the attending provider.

## 2018-03-04 DIAGNOSIS — M898X9 Other specified disorders of bone, unspecified site: Secondary | ICD-10-CM | POA: Insufficient documentation

## 2018-03-04 NOTE — Progress Notes (Addendum)
  Radiation Oncology         [phone] ________________________________  Name: Vernie Shanksevin Kullman MRN: [id number]  Date: 12/24/2017  DOB: [birth year]  SIMULATION AND TREATMENT PLANNING NOTE  DIAGNOSIS:     ICD-10-CM   1. Closed posterior wall acetabular fx, left, initial encounter (HCC) S32.422A   2. 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  Identity was confirmed.  All relevant records and images related to the planned course of therapy were reviewed.   Written consent to proceed with treatment was confirmed which was freely given after reviewing the details related to the planned course of therapy had been reviewed with the patient.  Then, the patient was set-up in a stable reproducible supine position for radiation therapy.  Xrays were obtained of the target hip area.     The images were reviewed and 2 treatment fields were designed and positioned to treat the appropriate target region. A simple isodose plan is requested.  PLAN:  The patient will receive 7 Gy in 1 fraction.    Simulation verification Port films were taken prior to treatment. Each of the treatment fields was reviewed and appropriately delineates the target regions and the patient was appropriate to proceed with radiation treatment.  ________________________________   Radene GunningJohn S. Lenell Lama, MD, PhD

## 2018-03-04 NOTE — Addendum Note (Signed)
Encounter addended by: Dorothy PufferMoody, Konni Kesinger, MD on: 03/04/2018 6:18 PM  Actions taken: Problem List modified, Visit diagnoses modified, Sign clinical note
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
